# Patient Record
Sex: Female | Born: 1950 | Race: Black or African American | Hispanic: No | Marital: Single | Smoking: Never smoker
Health system: Southern US, Community
[De-identification: ages and names within clinical notes are randomized; demographics above are authoritative.]

## PROBLEM LIST (undated history)

## (undated) DIAGNOSIS — E119 Type 2 diabetes mellitus without complications: Secondary | ICD-10-CM

## (undated) DIAGNOSIS — I10 Essential (primary) hypertension: Secondary | ICD-10-CM

## (undated) HISTORY — PX: ABDOMINAL HYSTERECTOMY: SHX81

---

## 2015-10-05 ENCOUNTER — Ambulatory Visit (INDEPENDENT_AMBULATORY_CARE_PROVIDER_SITE_OTHER): Payer: Medicare Other

## 2015-10-05 ENCOUNTER — Ambulatory Visit
Admission: EM | Admit: 2015-10-05 | Discharge: 2015-10-05 | Disposition: A | Discharge status: Home / Self Care | Payer: Medicare Other | Attending: Family Medicine | Admitting: Family Medicine

## 2015-10-05 ENCOUNTER — Encounter: Payer: Self-pay | Admitting: Emergency Medicine

## 2015-10-05 DIAGNOSIS — M79601 Pain in right arm: Secondary | ICD-10-CM | POA: Diagnosis not present

## 2015-10-05 DIAGNOSIS — M79659 Pain in unspecified thigh: Secondary | ICD-10-CM

## 2015-10-05 DIAGNOSIS — M79604 Pain in right leg: Secondary | ICD-10-CM

## 2015-10-05 DIAGNOSIS — M79606 Pain in leg, unspecified: Secondary | ICD-10-CM | POA: Diagnosis not present

## 2015-10-05 DIAGNOSIS — M79651 Pain in right thigh: Secondary | ICD-10-CM

## 2015-10-05 HISTORY — DX: Essential (primary) hypertension: I10

## 2015-10-05 HISTORY — DX: Type 2 diabetes mellitus without complications: E11.9

## 2015-10-05 MED ORDER — MELOXICAM 15 MG PO TABS: 15.0000 mg | ORAL_TABLET | Freq: Every day | ORAL | Status: AC

## 2015-10-05 NOTE — Discharge Instructions (Signed)

## 2015-10-05 NOTE — ED Notes (Signed)
Pt presents with right upper leg pain and itching for about one mth but worse in the last week. No known injury.

## 2015-10-05 NOTE — ED Provider Notes (Signed)
CSN: [id number]     Arrival date & time 10/05/15  [id number] History   First MD Initiated Contact with Patient 10/05/15 [phone]     Chief Complaint  Patient presents with  . Leg Pain   (Consider location/radiation/quality/duration/timing/severity/associated sxs/prior Treatment) HPI  This 65 year old female diabetic who presents with right distal lateral thigh pain. He states that she's had this for about a month but much worse over the last week. She has no known injury. She states last 2 nights has been miserable during the nighttime with the pain. She states that she has been increasing her walking from a half mile to a mile 3 days a week in an effort to help with her diabetes.Denies Any change in shoewear.   Past Medical History  Diagnosis Date  . Hypertension   . Diabetes mellitus without complication [facility](HCC)    Past Surgical History  Procedure Laterality Date  . Abdominal hysterectomy     Family History  Problem Relation Age of Onset  . Diabetes Mother   . Hypertension Mother   . Prostate cancer Father    Social History  Substance Use Topics  . Smoking status: Never Smoker   . Smokeless tobacco: None  . Alcohol Use: No   OB History    No data available     Review of Systems  Constitutional: Positive for activity change. Negative for fever, chills, appetite change and fatigue.  Musculoskeletal: Positive for myalgias.  All other systems reviewed and are negative.   Allergies  Penicillins and Sulfa antibiotics  Home Medications   Prior to Admission medications   Medication Sig Start Date End Date Taking? Authorizing Provider  atenolol (TENORMIN) 25 MG tablet Take 25 mg by mouth daily.   Yes Historical Provider, MD  hydrochlorothiazide (HYDRODIURIL) 25 MG tablet Take 25 mg by mouth daily.   Yes Historical Provider, MD  LORazepam (ATIVAN) 0.5 MG tablet Take 0.5 mg by mouth every 8 (eight) hours as needed for anxiety.   Yes Historical Provider, MD  metFORMIN (GLUCOPHAGE) 500  MG tablet Take 500 mg by mouth 2 (two) times daily with a meal.   Yes Historical Provider, MD  meloxicam (MOBIC) 15 MG tablet Take 1 tablet (15 mg total) by mouth daily. 10/05/15   Lutricia FeilWilliam P Ellery Tash, PA-C   Meds Ordered and Administered this Visit  Medications - No data to display  BP 132/79 mmHg  Pulse 83  Temp(Src) 97.8 F (36.6 C) (Oral)  Resp 18  Ht 5\' 1"  (1.549 m)  Wt 158 lb (71.668 kg)  BMI 29.87 kg/m2  SpO2 100% No data found.   Physical Exam  Constitutional: She is oriented to person, place, and time. She appears well-developed and well-nourished. No distress.  HENT:  Head: Normocephalic and atraumatic.  Eyes: Conjunctivae are normal. Pupils are equal, round, and reactive to light.  Neck: Normal range of motion. Neck supple.  Musculoskeletal: Normal range of motion. She exhibits tenderness. She exhibits no edema.  Neurological: She is alert and oriented to person, place, and time.  Skin: Skin is warm and dry. She is not diaphoretic.  Psychiatric: She has a normal mood and affect. Her behavior is normal. Judgment and thought content normal.  Nursing note and vitals reviewed.   ED Course  Procedures (including critical care time)  Labs Review Labs Reviewed - No data to display  Imaging Review Dg Femur Min 2 Views Right  10/05/2015  CLINICAL DATA:  Pain for 1 month, progressing past 2 days EXAM: RIGHT  FEMUR 2 VIEWS COMPARISON:  None. FINDINGS: Frontal and lateral views were obtained. There is no apparent fracture or dislocation. There is no abnormal periosteal reaction. There is osteoarthritic change in the right hip and knee joint regions. Narrowing is most notable in the knee region medially and in the patellofemoral joint. There is calcification in the superior aspect of the medial collateral ligament. 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 No fracture or dislocation. No erosive change or abnormal periosteal reaction. Electronically Signed   By: Bretta BangWilliam  Woodruff III M.D.   On: 10/05/2015 10:33     Visual Acuity Review  Right Eye Distance:   Left Eye Distance:   Bilateral Distance:    Right Eye Near:   Left Eye Near:    Bilateral Near:         MDM   1. Thigh pain, musculoskeletal, right   2. Musculoskeletal thigh pain, unspecified laterality    Discharge Medication List as of 10/05/2015 10:47 AM    START taking these medications   Details  meloxicam (MOBIC) 15 MG tablet Take 1 tablet (15 mg total) by mouth daily., Starting 10/05/2015, Until Discontinued, Normal      Plan: 1. Test/x-ray results and diagnosis reviewed with patient 2. rx as per orders; risks, benefits, potential side effects reviewed with patient 3. Recommend supportive treatment with Modification of her workouts prevent the thigh pain and gradually increase as the thigh pain decreases. I will give her Mobic for anti-inflammatory effect since she has been taking Tylenol which does not possess these properties. In addition I have recommended Biofreeze topical gel. She may want to also change her sneakers to a new pair and change her exercise surface to a high school track which will be less uneven and the roadway that she is walking on now. If she is not improving recommend that she be seen by an orthopedic surgeon in 2 weeks.   4. F/u prn if symptoms worsen or don't improve     Lutricia FeilWilliam P Renleigh Ouellet, PA-C 10/05/15 1053

## 2017-06-26 IMAGING — CR DG FEMUR 2+V*R*
4 series · 4 of 4 positions shown · non-contrast
Comparison: None.

CLINICAL DATA: Pain for 1 month, progressing past 2 days

EXAM:
RIGHT FEMUR 2 VIEWS

[femur ap (1 of 2)]
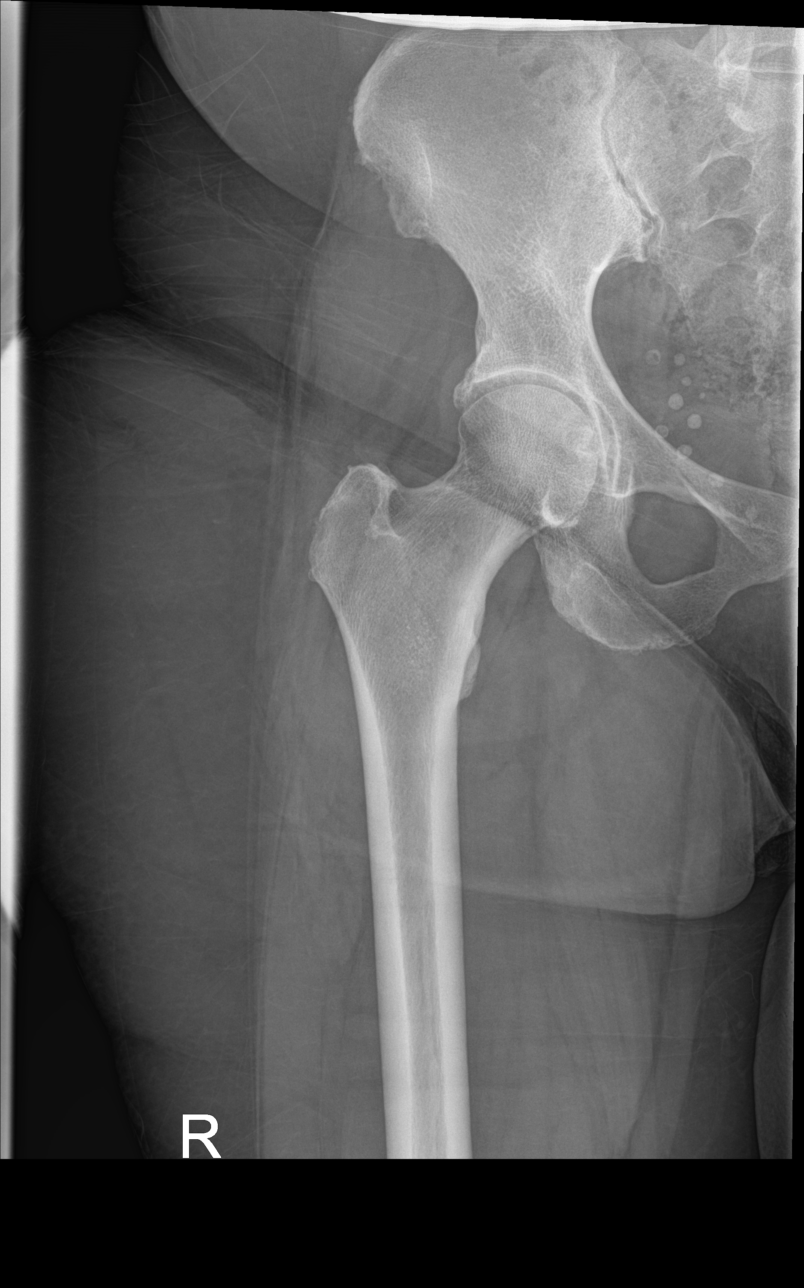

[femur ap (2 of 2)]
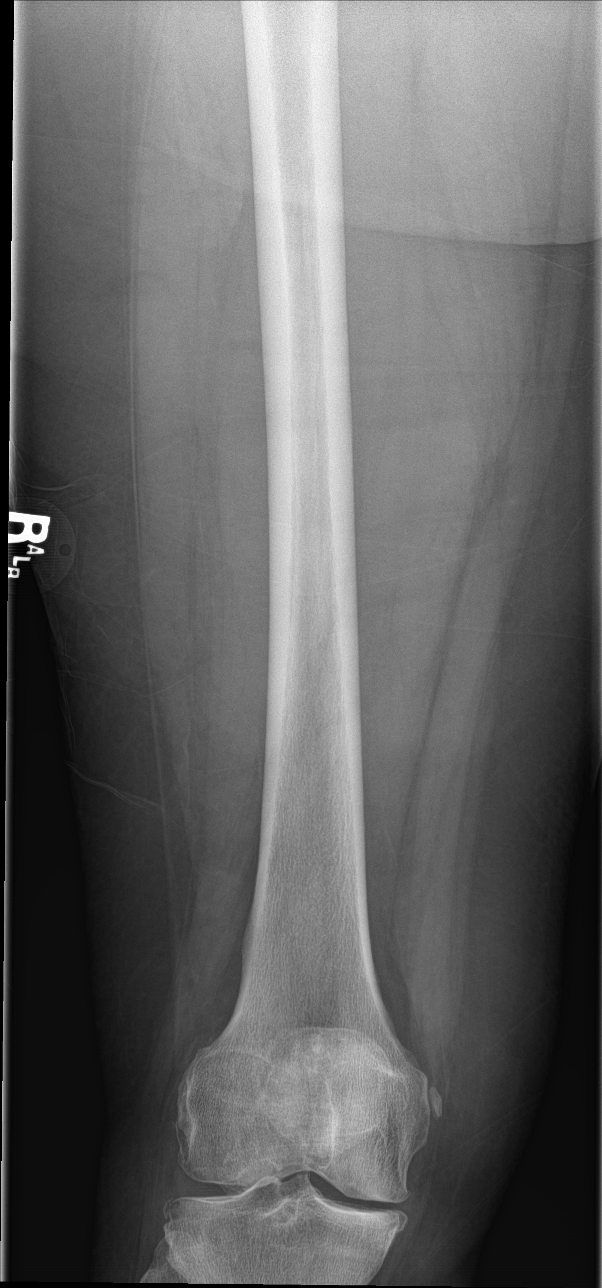

[femur lat (1 of 2)]
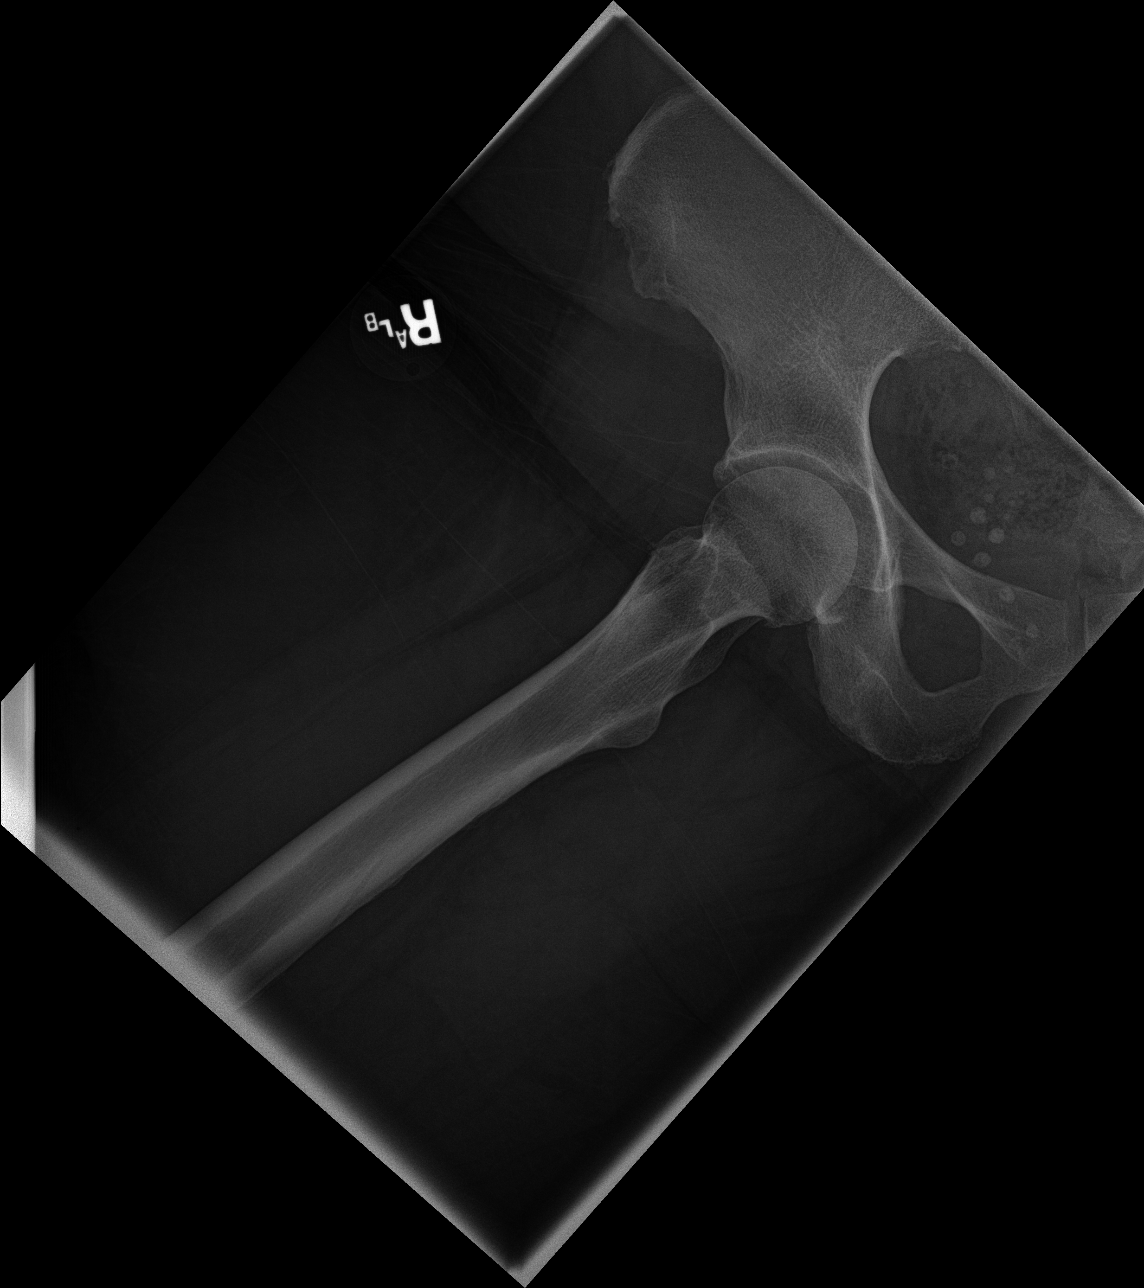

[femur lat (2 of 2)]
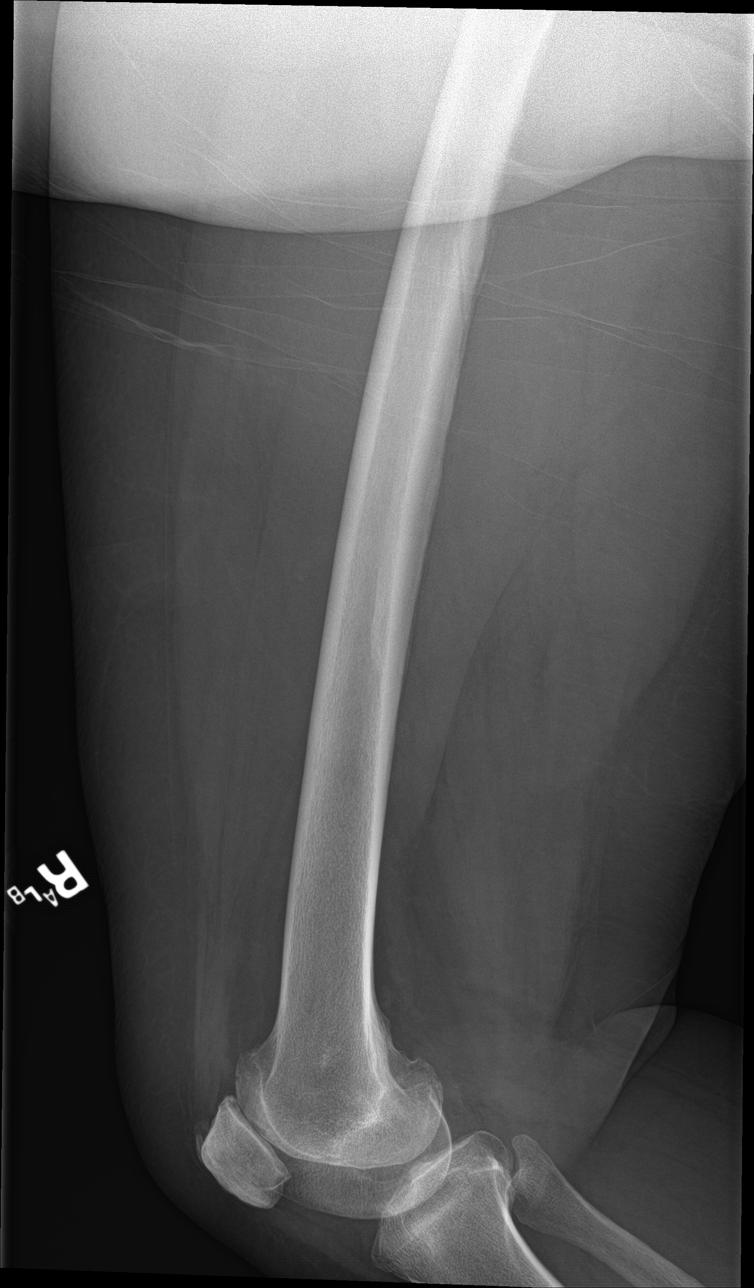

[4 of 4 positions shown; findings below may reference images not displayed]

FINDINGS: Frontal and lateral views were obtained. There is no apparent
fracture or dislocation. There is no abnormal periosteal reaction.
There is osteoarthritic change in the right hip and knee joint
regions. Narrowing is most notable in the knee region medially and
in the patellofemoral joint. There is calcification in the superior
aspect of the medial collateral ligament.
IMPRESSION: Osteoarthritic change in the hip and knee joints. Pellegrini-Stieda
disease in the knee, characterized by calcification along the medial
collateral ligament superiorly. No fracture or dislocation. No
erosive change or abnormal periosteal reaction.
